# Patient Record
Sex: Male | Born: 1975 | Race: White | Hispanic: No | Marital: Single | Smoking: Never smoker
Health system: Southern US, Community
[De-identification: ages and names within clinical notes are randomized; demographics above are authoritative.]

## PROBLEM LIST (undated history)

## (undated) DIAGNOSIS — E739 Lactose intolerance, unspecified: Secondary | ICD-10-CM

## (undated) DIAGNOSIS — Z8719 Personal history of other diseases of the digestive system: Secondary | ICD-10-CM

## (undated) DIAGNOSIS — I10 Essential (primary) hypertension: Secondary | ICD-10-CM

## (undated) DIAGNOSIS — I499 Cardiac arrhythmia, unspecified: Secondary | ICD-10-CM

## (undated) HISTORY — PX: COLONOSCOPY: SHX174

## (undated) HISTORY — PX: EYE SURGERY: SHX253

---

## 2005-02-09 ENCOUNTER — Emergency Department (HOSPITAL_COMMUNITY): Admission: EM | Admit: 2005-02-09 | Discharge: 2005-02-09 | Payer: Self-pay | Admitting: Emergency Medicine

## 2007-08-11 ENCOUNTER — Encounter (INDEPENDENT_AMBULATORY_CARE_PROVIDER_SITE_OTHER): Payer: Self-pay | Admitting: *Deleted

## 2007-08-11 ENCOUNTER — Ambulatory Visit: Payer: Self-pay | Admitting: Internal Medicine

## 2007-08-11 DIAGNOSIS — R197 Diarrhea, unspecified: Secondary | ICD-10-CM

## 2007-09-20 ENCOUNTER — Encounter: Payer: Self-pay | Admitting: Gastroenterology

## 2007-09-20 ENCOUNTER — Ambulatory Visit: Payer: Self-pay | Admitting: Gastroenterology

## 2007-09-20 ENCOUNTER — Ambulatory Visit: Payer: Self-pay | Admitting: Internal Medicine

## 2007-09-20 DIAGNOSIS — K922 Gastrointestinal hemorrhage, unspecified: Secondary | ICD-10-CM | POA: Insufficient documentation

## 2007-09-20 DIAGNOSIS — K589 Irritable bowel syndrome without diarrhea: Secondary | ICD-10-CM

## 2007-09-20 LAB — CONVERTED CEMR LAB
ALT: 22 units/L (ref 0–53)
AST: 25 units/L (ref 0–37)
Albumin: 4.2 g/dL (ref 3.5–5.2)
BUN: 10 mg/dL (ref 6–23)
Basophils Absolute: 0 10*3/uL (ref 0.0–0.1)
Basophils Relative: 0.4 % (ref 0.0–1.0)
Bilirubin, Direct: 0.2 mg/dL (ref 0.0–0.3)
CRP, High Sensitivity: 3 (ref 0.00–5.00)
Calcium: 9.3 mg/dL (ref 8.4–10.5)
Chloride: 107 meq/L (ref 96–112)
Creatinine, Ser: 0.9 mg/dL (ref 0.4–1.5)
Eosinophils Absolute: 0.1 10*3/uL (ref 0.0–0.7)
Eosinophils Relative: 1.9 % (ref 0.0–5.0)
Glucose, Bld: 106 mg/dL — ABNORMAL HIGH (ref 70–99)
LDL Cholesterol: 115 mg/dL — ABNORMAL HIGH (ref 0–99)
Lymphocytes Relative: 34.1 % (ref 12.0–46.0)
MCV: 91.7 fL (ref 78.0–100.0)
Monocytes Absolute: 0.3 10*3/uL (ref 0.1–1.0)
Monocytes Relative: 7.6 % (ref 3.0–12.0)
Neutrophils Relative %: 56 % (ref 43.0–77.0)
Platelets: 216 10*3/uL (ref 150–400)
Potassium: 4.6 meq/L (ref 3.5–5.1)
RBC: 5.16 M/uL (ref 4.22–5.81)
RDW: 11.8 % (ref 11.5–14.6)
TSH: 0.97 microintl units/mL (ref 0.35–5.50)
Total Bilirubin: 0.9 mg/dL (ref 0.3–1.2)
Total Protein: 7.1 g/dL (ref 6.0–8.3)
VLDL: 9 mg/dL (ref 0–40)
WBC: 4.4 10*3/uL — ABNORMAL LOW (ref 4.5–10.5)

## 2007-09-29 ENCOUNTER — Ambulatory Visit: Payer: Self-pay | Admitting: Internal Medicine

## 2007-09-29 DIAGNOSIS — R7309 Other abnormal glucose: Secondary | ICD-10-CM

## 2007-09-29 DIAGNOSIS — J029 Acute pharyngitis, unspecified: Secondary | ICD-10-CM

## 2007-09-29 LAB — CONVERTED CEMR LAB: Rapid Strep: NEGATIVE

## 2007-10-17 ENCOUNTER — Ambulatory Visit: Payer: Self-pay | Admitting: Gastroenterology

## 2013-11-03 ENCOUNTER — Other Ambulatory Visit: Payer: Self-pay | Admitting: Orthopedic Surgery

## 2013-11-08 ENCOUNTER — Encounter (HOSPITAL_COMMUNITY): Payer: Self-pay | Admitting: *Deleted

## 2013-11-08 MED ORDER — CEFAZOLIN SODIUM-DEXTROSE 2-3 GM-% IV SOLR
2.0000 g | INTRAVENOUS | Status: AC
  Administered 2013-11-09: 2 g via INTRAVENOUS
  Filled 2013-11-08: qty 50

## 2013-11-08 MED ORDER — POVIDONE-IODINE 7.5 % EX SOLN
Freq: Once | CUTANEOUS | Status: DC
  Filled 2013-11-08: qty 118

## 2013-11-08 NOTE — Progress Notes (Signed)
Pt returned call for pre-op. Did not have his medications with him. Only taking something for pain, thinks it's Naprosyn and he has one ? Hydrocodone left. I told him that a pharmacy tech might call him sometime today and if he could get his meds so that he can give them to the tech. I told him if he doesn't here from the pharmacy tech, to bring his meds with him in the AM to surgery. He voiced understanding.

## 2013-11-09 ENCOUNTER — Encounter (HOSPITAL_COMMUNITY)
Admission: RE | Disposition: A | Discharge status: Home / Self Care | Payer: Self-pay | Source: Ambulatory Visit | Attending: Orthopedic Surgery

## 2013-11-09 ENCOUNTER — Ambulatory Visit (HOSPITAL_COMMUNITY)
Admission: RE | Admit: 2013-11-09 | Discharge: 2013-11-09 | Disposition: A | Discharge status: Home / Self Care | Payer: Managed Care, Other (non HMO) | Source: Ambulatory Visit | Attending: Orthopedic Surgery | Admitting: Orthopedic Surgery

## 2013-11-09 ENCOUNTER — Ambulatory Visit (HOSPITAL_COMMUNITY): Payer: Managed Care, Other (non HMO)

## 2013-11-09 ENCOUNTER — Encounter (HOSPITAL_COMMUNITY): Payer: Managed Care, Other (non HMO) | Admitting: Anesthesiology

## 2013-11-09 ENCOUNTER — Encounter (HOSPITAL_COMMUNITY): Payer: Self-pay | Admitting: Anesthesiology

## 2013-11-09 ENCOUNTER — Ambulatory Visit (HOSPITAL_COMMUNITY): Payer: Managed Care, Other (non HMO) | Admitting: Anesthesiology

## 2013-11-09 DIAGNOSIS — S42009A Fracture of unspecified part of unspecified clavicle, initial encounter for closed fracture: Secondary | ICD-10-CM | POA: Insufficient documentation

## 2013-11-09 DIAGNOSIS — Y929 Unspecified place or not applicable: Secondary | ICD-10-CM | POA: Insufficient documentation

## 2013-11-09 DIAGNOSIS — K589 Irritable bowel syndrome without diarrhea: Secondary | ICD-10-CM | POA: Insufficient documentation

## 2013-11-09 DIAGNOSIS — E739 Lactose intolerance, unspecified: Secondary | ICD-10-CM | POA: Insufficient documentation

## 2013-11-09 DIAGNOSIS — I10 Essential (primary) hypertension: Secondary | ICD-10-CM | POA: Insufficient documentation

## 2013-11-09 HISTORY — DX: Lactose intolerance, unspecified: E73.9

## 2013-11-09 HISTORY — DX: Personal history of other diseases of the digestive system: Z87.19

## 2013-11-09 HISTORY — DX: Cardiac arrhythmia, unspecified: I49.9

## 2013-11-09 HISTORY — PX: ORIF CLAVICULAR FRACTURE: SHX5055

## 2013-11-09 HISTORY — DX: Essential (primary) hypertension: I10

## 2013-11-09 LAB — CBC
HCT: 45.6 % (ref 39.0–52.0)
HEMOGLOBIN: 16.2 g/dL (ref 13.0–17.0)
MCH: 32.3 pg (ref 26.0–34.0)
MCHC: 35.5 g/dL (ref 30.0–36.0)
MCV: 91 fL (ref 78.0–100.0)
Platelets: 212 10*3/uL (ref 150–400)
RBC: 5.01 MIL/uL (ref 4.22–5.81)
RDW: 12.1 % (ref 11.5–15.5)
WBC: 4.5 10*3/uL (ref 4.0–10.5)

## 2013-11-09 SURGERY — OPEN REDUCTION INTERNAL FIXATION (ORIF) CLAVICULAR FRACTURE
Anesthesia: General | Site: Shoulder | Laterality: Left

## 2013-11-09 MED ORDER — MIDAZOLAM HCL 2 MG/2ML IJ SOLN
INTRAMUSCULAR | Status: AC
  Filled 2013-11-09: qty 2

## 2013-11-09 MED ORDER — DOCUSATE SODIUM 100 MG PO CAPS: 100.0000 mg | ORAL_CAPSULE | Freq: Three times a day (TID) | ORAL | Status: AC | PRN

## 2013-11-09 MED ORDER — PROPOFOL 10 MG/ML IV BOLUS
INTRAVENOUS | Status: AC
  Filled 2013-11-09: qty 20

## 2013-11-09 MED ORDER — ROCURONIUM BROMIDE 50 MG/5ML IV SOLN
INTRAVENOUS | Status: AC
  Filled 2013-11-09: qty 1

## 2013-11-09 MED ORDER — BUPIVACAINE-EPINEPHRINE 0.25% -1:200000 IJ SOLN
INTRAMUSCULAR | Status: DC | PRN
  Administered 2013-11-09: 10 mL

## 2013-11-09 MED ORDER — FENTANYL CITRATE 0.05 MG/ML IJ SOLN
INTRAMUSCULAR | Status: DC | PRN
  Administered 2013-11-09 (×2): 50 ug via INTRAVENOUS
  Administered 2013-11-09: 100 ug via INTRAVENOUS
  Administered 2013-11-09 (×2): 50 ug via INTRAVENOUS

## 2013-11-09 MED ORDER — LACTATED RINGERS IV SOLN
INTRAVENOUS | Status: DC | PRN
  Administered 2013-11-09 (×2): via INTRAVENOUS

## 2013-11-09 MED ORDER — MIDAZOLAM HCL 5 MG/5ML IJ SOLN
INTRAMUSCULAR | Status: DC | PRN
  Administered 2013-11-09: 2 mg via INTRAVENOUS

## 2013-11-09 MED ORDER — SODIUM CHLORIDE 0.9 % IR SOLN
Status: DC | PRN
  Administered 2013-11-09: 1000 mL

## 2013-11-09 MED ORDER — GLYCOPYRROLATE 0.2 MG/ML IJ SOLN
INTRAMUSCULAR | Status: DC | PRN
  Administered 2013-11-09: 0.6 mg via INTRAVENOUS
  Administered 2013-11-09: 0.2 mg via INTRAVENOUS

## 2013-11-09 MED ORDER — ROCURONIUM BROMIDE 100 MG/10ML IV SOLN
INTRAVENOUS | Status: DC | PRN
Start: 2013-11-09 — End: 2013-11-09
  Administered 2013-11-09: 50 mg via INTRAVENOUS

## 2013-11-09 MED ORDER — NEOSTIGMINE METHYLSULFATE 10 MG/10ML IV SOLN
INTRAVENOUS | Status: DC | PRN
  Administered 2013-11-09: 3 mg via INTRAVENOUS

## 2013-11-09 MED ORDER — DEXTROSE 5 % IV SOLN
10.0000 mg | INTRAVENOUS | Status: DC | PRN
  Administered 2013-11-09: 20 ug/min via INTRAVENOUS

## 2013-11-09 MED ORDER — FENTANYL CITRATE 0.05 MG/ML IJ SOLN
INTRAMUSCULAR | Status: AC
  Filled 2013-11-09: qty 5

## 2013-11-09 MED ORDER — HYDROMORPHONE HCL PF 1 MG/ML IJ SOLN
0.2500 mg | INTRAMUSCULAR | Status: DC | PRN
  Administered 2013-11-09: 0.5 mg via INTRAVENOUS
  Administered 2013-11-09 (×2): 0.25 mg via INTRAVENOUS
  Administered 2013-11-09 (×2): 0.5 mg via INTRAVENOUS

## 2013-11-09 MED ORDER — OXYCODONE-ACETAMINOPHEN 5-325 MG PO TABS: 1.0000 | ORAL_TABLET | ORAL | Status: AC | PRN

## 2013-11-09 MED ORDER — LACTATED RINGERS IV SOLN
INTRAVENOUS | Status: DC
  Administered 2013-11-09: 09:00:00 via INTRAVENOUS

## 2013-11-09 MED ORDER — PROPOFOL 10 MG/ML IV BOLUS
INTRAVENOUS | Status: DC | PRN
  Administered 2013-11-09: 200 mg via INTRAVENOUS

## 2013-11-09 MED ORDER — OXYCODONE HCL 5 MG/5ML PO SOLN: 5.0000 mg | Freq: Once | ORAL | Status: AC | PRN

## 2013-11-09 MED ORDER — ONDANSETRON HCL 4 MG/2ML IJ SOLN
INTRAMUSCULAR | Status: AC
  Filled 2013-11-09: qty 2

## 2013-11-09 MED ORDER — OXYCODONE HCL 5 MG PO TABS
ORAL_TABLET | ORAL | Status: AC
  Filled 2013-11-09: qty 1

## 2013-11-09 MED ORDER — HYDROMORPHONE HCL PF 1 MG/ML IJ SOLN
INTRAMUSCULAR | Status: AC
  Filled 2013-11-09: qty 1

## 2013-11-09 MED ORDER — LIDOCAINE HCL (CARDIAC) 20 MG/ML IV SOLN
INTRAVENOUS | Status: AC
  Filled 2013-11-09: qty 5

## 2013-11-09 MED ORDER — OXYCODONE HCL 5 MG PO TABS
5.0000 mg | ORAL_TABLET | Freq: Once | ORAL | Status: AC | PRN
  Administered 2013-11-09: 5 mg via ORAL

## 2013-11-09 MED ORDER — LIDOCAINE HCL (CARDIAC) 20 MG/ML IV SOLN
INTRAVENOUS | Status: DC | PRN
  Administered 2013-11-09: 100 mg via INTRATRACHEAL

## 2013-11-09 MED ORDER — PROMETHAZINE HCL 25 MG/ML IJ SOLN: 6.2500 mg | INTRAMUSCULAR | Status: DC | PRN

## 2013-11-09 MED ORDER — BUPIVACAINE-EPINEPHRINE (PF) 0.25% -1:200000 IJ SOLN
INTRAMUSCULAR | Status: AC
  Filled 2013-11-09: qty 30

## 2013-11-09 MED ORDER — ONDANSETRON HCL 4 MG/2ML IJ SOLN
INTRAMUSCULAR | Status: DC | PRN
  Administered 2013-11-09: 4 mg via INTRAVENOUS

## 2013-11-09 SURGICAL SUPPLY — 52 items
BIT DRILL 2.8X5 QR DISP (BIT) ×2 IMPLANT
CHLORAPREP W/TINT 26ML (MISCELLANEOUS) ×3 IMPLANT
CLOSURE WOUND 1/2 X4 (GAUZE/BANDAGES/DRESSINGS) ×1
COVER SURGICAL LIGHT HANDLE (MISCELLANEOUS) ×3 IMPLANT
DRAPE C-ARM 42X72 X-RAY (DRAPES) ×3 IMPLANT
DRAPE INCISE IOBAN 66X45 STRL (DRAPES) ×3 IMPLANT
DRAPE U-SHAPE 47X51 STRL (DRAPES) ×3 IMPLANT
DRSG MEPILEX BORDER 4X8 (GAUZE/BANDAGES/DRESSINGS) ×2 IMPLANT
ELECT REM PT RETURN 9FT ADLT (ELECTROSURGICAL) ×3
ELECTRODE REM PT RTRN 9FT ADLT (ELECTROSURGICAL) IMPLANT
GLOVE BIO SURGEON STRL SZ7 (GLOVE) ×3 IMPLANT
GLOVE BIO SURGEON STRL SZ7.5 (GLOVE) ×3 IMPLANT
GLOVE BIOGEL PI IND STRL 7.0 (GLOVE) ×1 IMPLANT
GLOVE BIOGEL PI IND STRL 8 (GLOVE) ×1 IMPLANT
GLOVE BIOGEL PI INDICATOR 7.0 (GLOVE) ×2
GLOVE BIOGEL PI INDICATOR 8 (GLOVE) ×2
GOWN STRL REUS W/ TWL LRG LVL3 (GOWN DISPOSABLE) ×3 IMPLANT
GOWN STRL REUS W/ TWL XL LVL3 (GOWN DISPOSABLE) ×1 IMPLANT
GOWN STRL REUS W/TWL LRG LVL3 (GOWN DISPOSABLE) ×9
GOWN STRL REUS W/TWL XL LVL3 (GOWN DISPOSABLE) ×3
KIT BASIN OR (CUSTOM PROCEDURE TRAY) ×3 IMPLANT
KIT BIO-TENODESIS 3X8 DISP (MISCELLANEOUS)
KIT INSRT BABSR STRL DISP BTN (MISCELLANEOUS) IMPLANT
KIT ROOM TURNOVER OR (KITS) ×3 IMPLANT
MANIFOLD NEPTUNE WASTE (CANNULA) ×3 IMPLANT
NDL HYPO 25GX1X1/2 BEV (NEEDLE) ×1 IMPLANT
NDL SPNL 18GX3.5 QUINCKE PK (NEEDLE) ×1 IMPLANT
NEEDLE HYPO 25GX1X1/2 BEV (NEEDLE) ×3 IMPLANT
NEEDLE SPNL 18GX3.5 QUINCKE PK (NEEDLE) ×3 IMPLANT
NS IRRIG 1000ML POUR BTL (IV SOLUTION) ×3 IMPLANT
PACK SHOULDER (CUSTOM PROCEDURE TRAY) ×3 IMPLANT
PAD ARMBOARD 7.5X6 YLW CONV (MISCELLANEOUS) ×6 IMPLANT
PLATE CLAVICLE 10 HOLE (Plate) ×2 IMPLANT
RETRIEVER SUT HEWSON (MISCELLANEOUS) ×2 IMPLANT
SCREW 3.5MMX14.0MM (Screw) ×6 IMPLANT
SCREW CORT 3.5X14 (Screw) ×2 IMPLANT
SCREW CORT 3.5X16 (Screw) ×4 IMPLANT
SLING ARM IMMOBILIZER LRG (SOFTGOODS) ×2 IMPLANT
SLING ARM LRG ADULT FOAM STRAP (SOFTGOODS) ×3 IMPLANT
SLING ARM MED ADULT FOAM STRAP (SOFTGOODS) IMPLANT
STRIP CLOSURE SKIN 1/2X4 (GAUZE/BANDAGES/DRESSINGS) ×2 IMPLANT
SUCTION FRAZIER TIP 10 FR DISP (SUCTIONS) ×3 IMPLANT
SUT FIBERWIRE #2 38 T-5 BLUE (SUTURE) ×3
SUT MON AB 4-0 PC3 18 (SUTURE) ×3 IMPLANT
SUT PDS AB 1 CT  36 (SUTURE) ×2
SUT PDS AB 1 CT 36 (SUTURE) ×1 IMPLANT
SUT VICRYL 0 CT 1 36IN (SUTURE) ×3 IMPLANT
SUTURE FIBERWR #2 38 T-5 BLUE (SUTURE) IMPLANT
SYR CONTROL 10ML LL (SYRINGE) ×3 IMPLANT
TOWEL OR 17X24 6PK STRL BLUE (TOWEL DISPOSABLE) ×3 IMPLANT
TOWEL OR 17X26 10 PK STRL BLUE (TOWEL DISPOSABLE) ×3 IMPLANT
WATER STERILE IRR 1000ML POUR (IV SOLUTION) ×3 IMPLANT
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
## 2013-11-09 NOTE — Transfer of Care (Signed)
Immediate Anesthesia Transfer of Care Note  Patient: Charles Andrade  Procedure(s) Performed: Procedure(s) with comments: OPEN REDUCTION INTERNAL FIXATION (ORIF) CLAVICULAR FRACTURE (Left) - Left open reduction internal fixatin clavical fracture  Patient Location: PACU  Anesthesia Type:General  Level of Consciousness: awake, alert , oriented and patient cooperative  Airway & Oxygen Therapy: Patient Spontanous Breathing and Patient connected to nasal cannula oxygen  Post-op Assessment: Report given to PACU RN and Post -op Vital signs reviewed and stable  Post vital signs: Reviewed  Complications: No apparent anesthesia complications
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
## 2013-11-09 NOTE — Anesthesia Preprocedure Evaluation (Signed)
Anesthesia Evaluation  Patient identified by MRN, date of birth, ID band Patient awake    Reviewed: Allergy & Precautions, H&P , NPO status , Patient's Chart, lab work & pertinent test results  Airway Mallampati: II TM Distance: >3 FB Neck ROM: full    Dental  (+) Teeth Intact, Dental Advidsory Given   Pulmonary neg pulmonary ROS,  breath sounds clear to auscultation        Cardiovascular hypertension, Rhythm:regular Rate:Normal     Neuro/Psych negative neurological ROS  negative psych ROS   GI/Hepatic negative GI ROS, Neg liver ROS,   Endo/Other  negative endocrine ROS  Renal/GU negative Renal ROS     Musculoskeletal   Abdominal   Peds  Hematology   Anesthesia Other Findings   Reproductive/Obstetrics negative OB ROS                           Anesthesia Physical Anesthesia Plan  ASA: II  Anesthesia Plan: General ETT   Post-op Pain Management:    Induction:   Airway Management Planned:   Additional Equipment:   Intra-op Plan:   Post-operative Plan:   Informed Consent: I have reviewed the patients History and Physical, chart, labs and discussed the procedure including the risks, benefits and alternatives for the proposed anesthesia with the patient or authorized representative who has indicated his/her understanding and acceptance.   Dental Advisory Given  Plan Discussed with: Anesthesiologist, CRNA and Surgeon  Anesthesia Plan Comments:         Anesthesia Quick Evaluation

## 2013-11-09 NOTE — Anesthesia Procedure Notes (Signed)
Procedure Name: Intubation Date/Time: 11/09/2013 10:28 AM Performed by: Romie Minus K Pre-anesthesia Checklist: Patient identified, Emergency Drugs available, Suction available, Patient being monitored and Timeout performed Patient Re-evaluated:Patient Re-evaluated prior to inductionOxygen Delivery Method: Circle system utilized Preoxygenation: Pre-oxygenation with 100% oxygen Intubation Type: IV induction Ventilation: Mask ventilation without difficulty Laryngoscope Size: Miller and 3 Grade View: Grade I Tube type: Oral Tube size: 7.5 mm Number of attempts: 1 Airway Equipment and Method: Stylet Placement Confirmation: ETT inserted through vocal cords under direct vision,  positive ETCO2,  CO2 detector and breath sounds checked- equal and bilateral Secured at: 23 cm Tube secured with: Tape Dental Injury: Teeth and Oropharynx as per pre-operative assessment

## 2013-11-09 NOTE — Anesthesia Postprocedure Evaluation (Signed)
Anesthesia Post Note  Patient: Charles Andrade  Procedure(s) Performed: Procedure(s) (LRB): OPEN REDUCTION INTERNAL FIXATION (ORIF) CLAVICULAR FRACTURE (Left)  Anesthesia type: general  Patient location: PACU  Post pain: Pain level controlled  Post assessment: Patient's Cardiovascular Status Stable  Last Vitals:  Filed Vitals:   11/09/13 1315  BP: 143/82  Pulse: 51  Temp:   Resp: 14    Post vital signs: Reviewed and stable  Level of consciousness: sedated  Complications: No apparent anesthesia complications

## 2013-11-09 NOTE — Discharge Instructions (Signed)
Discharge Instructions after Open Shoulder Repair ° °A sling has been provided for you. Remain in your sling at all times. This includes sleeping in your sling.  °Use ice on the shoulder intermittently over the first 48 hours after surgery.  °Pain medicine has been prescribed for you.  °Use your medicine liberally over the first 48 hours, and then you can begin to taper your use. You may take Extra Strength Tylenol or Tylenol only in place of the pain pills. DO NOT take ANY nonsteroidal anti-inflammatory pain medications: Advil, Motrin, Ibuprofen, Aleve, Naproxen or Naprosyn.  °You may remove your dressing after two days  °You may shower 5 days after surgery. The incisions CANNOT get wet prior to 5 days. Simply allow the water to wash over the site and then pat dry. Do not rub the incisions. Make sure your axilla (armpit) is completely dry after showering.  °Take one aspirin, a day for 2 weeks after surgery, unless you have an aspirin sensitivity/ allergy or asthma. ° ° °Please call [phone] during normal business hours or [phone] after hours for any problems. Including the following: ° °- excessive redness of the incisions °- drainage for more than 4 days °- fever of more than 101.5 F ° °*Please note that pain medications will not be refilled after hours or on weekends. ° °What to eat: ° °For your first meals, you should eat lightly; only small meals initially.  If you do not have nausea, you may eat larger meals.  Avoid spicy, greasy and heavy food.   ° °General Anesthesia, Adult, Care After  °Refer to this sheet in the next few weeks. These instructions provide you with information on caring for yourself after your procedure. Your health care provider may also give you more specific instructions. Your treatment has been planned according to current medical practices, but problems sometimes occur. Call your health care provider if you have any problems or questions after your procedure.  °WHAT TO EXPECT  AFTER THE PROCEDURE  °After the procedure, it is typical to experience:  °Sleepiness.  °Nausea and vomiting. °HOME CARE INSTRUCTIONS  °For the first 24 hours after general anesthesia:  °Have a responsible person with you.  °Do not drive a car. If you are alone, do not take public transportation.  °Do not drink alcohol.  °Do not take medicine that has not been prescribed by your health care provider.  °Do not sign important papers or make important decisions.  °You may resume a normal diet and activities as directed by your health care provider.  °Change bandages (dressings) as directed.  °If you have questions or problems that seem related to general anesthesia, call the hospital and ask for the anesthetist or anesthesiologist on call. °SEEK MEDICAL CARE IF:  °You have nausea and vomiting that continue the day after anesthesia.  °You develop a rash. °SEEK IMMEDIATE MEDICAL CARE IF:  °You have difficulty breathing.  °You have chest pain.  °You have any allergic problems. °Document Released: 08/31/2000 Document Revised: 01/25/2013 Document Reviewed: 12/08/2012  °Exit[employer]® Patient Information ©2014 [employer].  ° ° °

## 2013-11-09 NOTE — Progress Notes (Signed)
Care of pt assumed by MA Torri Langston RN from L. Reynolds RN 

## 2013-11-10 ENCOUNTER — Encounter (HOSPITAL_COMMUNITY): Payer: Self-pay | Admitting: Orthopedic Surgery

## 2014-09-26 ENCOUNTER — Emergency Department (HOSPITAL_COMMUNITY)
Admission: EM | Admit: 2014-09-26 | Discharge: 2014-09-26 | Disposition: A | Discharge status: Home / Self Care | Payer: Managed Care, Other (non HMO) | Source: Home / Self Care | Attending: Emergency Medicine | Admitting: Emergency Medicine

## 2014-09-26 ENCOUNTER — Encounter (HOSPITAL_COMMUNITY): Payer: Self-pay

## 2014-09-26 ENCOUNTER — Emergency Department (INDEPENDENT_AMBULATORY_CARE_PROVIDER_SITE_OTHER): Payer: Managed Care, Other (non HMO)

## 2014-09-26 DIAGNOSIS — J40 Bronchitis, not specified as acute or chronic: Secondary | ICD-10-CM

## 2014-09-26 DIAGNOSIS — J9801 Acute bronchospasm: Secondary | ICD-10-CM | POA: Diagnosis not present

## 2014-09-26 MED ORDER — IPRATROPIUM BROMIDE 0.02 % IN SOLN
RESPIRATORY_TRACT | Status: AC
  Filled 2014-09-26: qty 2.5

## 2014-09-26 MED ORDER — LEVOFLOXACIN 750 MG PO TABS: 750.0000 mg | ORAL_TABLET | Freq: Every day | ORAL | Status: AC

## 2014-09-26 MED ORDER — ALBUTEROL SULFATE (2.5 MG/3ML) 0.083% IN NEBU
5.0000 mg | INHALATION_SOLUTION | Freq: Once | RESPIRATORY_TRACT | Status: AC
  Administered 2014-09-26: 5 mg via RESPIRATORY_TRACT

## 2014-09-26 MED ORDER — ALBUTEROL SULFATE HFA 108 (90 BASE) MCG/ACT IN AERS: 1.0000 | INHALATION_SPRAY | Freq: Four times a day (QID) | RESPIRATORY_TRACT | Status: AC | PRN

## 2014-09-26 MED ORDER — ALBUTEROL SULFATE (2.5 MG/3ML) 0.083% IN NEBU
INHALATION_SOLUTION | RESPIRATORY_TRACT | Status: AC
  Filled 2014-09-26: qty 6

## 2014-09-26 MED ORDER — PREDNISONE 10 MG PO TABS: ORAL_TABLET | ORAL | Status: AC

## 2014-09-26 MED ORDER — IPRATROPIUM BROMIDE 0.02 % IN SOLN
0.5000 mg | Freq: Once | RESPIRATORY_TRACT | Status: AC
  Administered 2014-09-26: 0.5 mg via RESPIRATORY_TRACT

## 2014-09-26 MED ORDER — TRAMADOL HCL 50 MG PO TABS: 50.0000 mg | ORAL_TABLET | Freq: Two times a day (BID) | ORAL | Status: AC | PRN

## 2014-09-26 NOTE — ED Notes (Signed)
C/o persistant cough, in spite of completion of antibiotic and steroid shot from another [facility] . NAD at present

## 2014-09-26 NOTE — Discharge Instructions (Signed)
While your chest xray today was normal, you examination suggests persistent bronchitis (inflammation of the lung bronchi). Please use medications as directed and if you have no improvement, you are strongly encouraged to follow up with [facility] (see contact information on your discharge paperwork). If symptoms become suddenly worse or severe, you will need to seek re-evaluation at your nearest emergency room.  Bronchospasm A bronchospasm is a spasm or tightening of the airways going into the lungs. During a bronchospasm breathing becomes more difficult because the airways get smaller. When this happens there can be coughing, a whistling sound when breathing (wheezing), and difficulty breathing. Bronchospasm is often associated with asthma, but not all patients who experience a bronchospasm have asthma. CAUSES  A bronchospasm is caused by inflammation or irritation of the airways. The inflammation or irritation may be triggered by:   Allergies (such as to animals, pollen, food, or mold). Allergens that cause bronchospasm may cause wheezing immediately after exposure or many hours later.   Infection. Viral infections are believed to be the most common cause of bronchospasm.   Exercise.   Irritants (such as pollution, cigarette smoke, strong odors, aerosol sprays, and paint fumes).   Weather changes. Winds increase molds and pollens in the air. Rain refreshes the air by washing irritants out. Cold air may cause inflammation.   Stress and emotional upset.  SIGNS AND SYMPTOMS   Wheezing.   Excessive nighttime coughing.   Frequent or severe coughing with a simple cold.   Chest tightness.   Shortness of breath.  DIAGNOSIS  Bronchospasm is usually diagnosed through a history and physical exam. Tests, such as chest X-rays, are sometimes done to look for other conditions. TREATMENT   Inhaled medicines can be given to open up your airways and help you breathe. The  medicines can be given using either an inhaler or a nebulizer machine.  Corticosteroid medicines may be given for severe bronchospasm, usually when it is associated with asthma. HOME CARE INSTRUCTIONS   Always have a plan prepared for seeking medical care. Know when to call your health care provider and local emergency services (911 in the [place] Know where you can access local emergency care.  Only take medicines as directed by your health care provider.  If you were prescribed an inhaler or nebulizer machine, ask your health care provider to explain how to use it correctly. Always use a spacer with your inhaler if you were given one.  It is necessary to remain calm during an attack. Try to relax and breathe more slowly.  Control your home environment in the following ways:   Change your heating and air conditioning filter at least once a month.   Limit your use of fireplaces and wood stoves.  Do not smoke and do not allow smoking in your home.   Avoid exposure to perfumes and fragrances.   Get rid of pests (such as roaches and mice) and their droppings.   Throw away plants if you see mold on them.   Keep your house clean and dust free.   Replace carpet with wood, tile, or vinyl flooring. Carpet can trap dander and dust.   Use allergy-proof pillows, mattress covers, and box spring covers.   Wash bed sheets and blankets every week in hot water and dry them in a dryer.   Use blankets that are made of polyester or cotton.   Wash hands frequently. SEEK MEDICAL CARE IF:   You have muscle aches.   You have  chest pain.   The sputum changes from clear or white to yellow, green, gray, or bloody.   The sputum you cough up gets thicker.   There are problems that may be related to the medicine you are given, such as a rash, itching, swelling, or trouble breathing.  SEEK IMMEDIATE MEDICAL CARE IF:   You have worsening wheezing and coughing even after taking  your prescribed medicines.   You have increased difficulty breathing.   You develop severe chest pain. MAKE SURE YOU:   Understand these instructions.  Will watch your condition.  Will get help right away if you are not doing well or get worse. Document Released: 05/28/2003 Document Revised: 05/30/2013 Document Reviewed: 11/14/2012 [facility] Patient Information 2015 [place], [place]. This information is not intended to replace advice given to you by your health care provider. Make sure you discuss any questions you have with your health care provider.

## 2014-09-26 NOTE — ED Provider Notes (Addendum)
CSN: [id number]     Arrival date & time 09/26/14  1224 History   First MD Initiated Contact with Patient 09/26/14 1352     Chief Complaint  Patient presents with  . Cough   (Consider location/radiation/quality/duration/timing/severity/associated sxs/prior Treatment) HPI Comments: PCP: none Works at [employer] he was treated with antibiotics and steroids recently by another local urgent care and has not had any improvement. No recent travel or possible exposures to TB. No fever, night sweats, unexplained weight loss or hemoptysis.   Patient is a 39 y.o. male presenting with cough. The history is provided by the patient.  Cough Cough characteristics:  Non-productive Severity:  Moderate Onset quality:  Gradual Duration:  3 weeks Timing:  Constant Progression:  Worsening Smoker: no   Associated symptoms: wheezing   Associated symptoms: no chest pain, no chills, no diaphoresis and no fever   Associated symptoms comment:  States his lateral lower chest wall and rib cage is very sore from coughing   Past Medical History  Diagnosis Date  . Dysrhythmia     irregular heart rate (in high school) No longer has it  . Lactose intolerance   . History of IBS   . Hypertension     on no meds for this, no problems since high school   Past Surgical History  Procedure Laterality Date  . Colonoscopy    . Eye surgery Bilateral     lasik  . Orif clavicular fracture Left 11/09/2013    Procedure: OPEN REDUCTION INTERNAL FIXATION (ORIF) CLAVICULAR FRACTURE;  Surgeon: Mable Paris, MD;  Location[facility] OR;  Service: Orthopedics;  Laterality: Left;  Left open reduction internal fixatin clavical fracture   Family History  Problem Relation Age of Onset  . CAD Mother   . Hypertension Mother   . Hypertension Father   . Diabetes type II Father   . CVA Father    History  Substance Use Topics  . Smoking status: Never Smoker   . Smokeless tobacco: Never Used  . Alcohol Use:  Yes     Comment: daily - couple of drinks a day    Review of Systems  Constitutional: Negative for fever, chills, diaphoresis and unexpected weight change.  HENT: Negative.   Eyes: Negative.   Respiratory: Positive for cough and wheezing.   Cardiovascular: Negative for chest pain, palpitations and leg swelling.       See HPI  Gastrointestinal: Negative.   Skin: Negative.   Neurological: Negative for dizziness.    Allergies  Review of patient's allergies indicates no known allergies.  Home Medications   Prior to Admission medications   Medication Sig Start Date End Date Taking? Authorizing Provider  albuterol (PROVENTIL HFA;VENTOLIN HFA) 108 (90 BASE) MCG/ACT inhaler Inhale 1-2 puffs into the lungs every 6 (six) hours as needed for wheezing or shortness of breath. Or persistent coughing 09/26/14   Mathis Fare Presson, PA  docusate sodium (COLACE) 100 MG capsule Take 1 capsule (100 mg total) by mouth 3 (three) times daily as needed. 11/09/13   Jiles Harold, PA-C  levofloxacin (LEVAQUIN) 750 MG tablet Take 1 tablet (750 mg total) by mouth daily. 09/26/14   Mathis Fare Presson, PA  oxyCODONE-acetaminophen (ROXICET) 5-325 MG per tablet Take 1-2 tablets by mouth every 4 (four) hours as needed for severe pain. 11/09/13   Jiles Harold, PA-C  predniSONE (DELTASONE) 10 MG tablet 5 tabs po QD day 1, 4 tabs po QD day 2, 3 tabs po QD day  3, 2 tabs po QD day 4, 1 tab po QD day 5 then stop 09/26/14   Ria ClockJennifer Lee H Presson, PA  traMADol (ULTRAM) 50 MG tablet Take 1 tablet (50 mg total) by mouth every 12 (twelve) hours as needed for moderate pain (and/or for cough suppression). 09/26/14   Jess BartersJennifer Lee H Presson, PA   BP 149/84 mmHg  Pulse 61  Temp(Src) 98.1 F (36.7 C) (Oral)  Resp 20  SpO2 97% Physical Exam  Constitutional: He is oriented to person, place, and time. He appears well-developed and well-nourished. No distress.  HENT:  Head: Normocephalic and atraumatic.  Right Ear:  External ear normal.  Left Ear: External ear normal.  Nose: Nose normal.  Mouth/Throat: Oropharynx is clear and moist.  Eyes: Conjunctivae are normal.  Neck: Normal range of motion. Neck supple.  Cardiovascular: Normal rate, regular rhythm and normal heart sounds.   Pulmonary/Chest: Effort normal. No stridor. No tachypnea. No respiratory distress. He has no decreased breath sounds. He has wheezes. He has no rhonchi. He has no rales. He exhibits tenderness.    +moderate diffuse wheezing Outlines are regions of chest wall tenderness with cough or palpation   Musculoskeletal: Normal range of motion.  Neurological: He is alert and oriented to person, place, and time.  Skin: Skin is warm and dry.  Psychiatric: He has a normal mood and affect. His behavior is normal.  Nursing note and vitals reviewed.   ED Course  Procedures (including critical care time) Labs Review Labs Reviewed - No data to display  Imaging Review Dg Chest 2 View  09/26/2014   CLINICAL DATA:  Cough and congestion  EXAM: CHEST  2 VIEW  COMPARISON:  None.  FINDINGS: The heart size and mediastinal contours are within normal limits. Both lungs are clear. The visualized skeletal structures show postsurgical changes in the left clavicle.  IMPRESSION: No active cardiopulmonary disease.   Electronically Signed   By: Alcide CleverMark  Lukens M.D.   On: 09/26/2014 14:19     MDM   1. Bronchitis   2. Bronchospasm   chest xray today was normal, however, examination suggests persistent bronchitis. Advised  medications as directed and if you have no improvement,  strongly encouraged to follow up with [facility] . If symptoms become suddenly worse or severe, you will need to seek re-evaluation at your nearest emergency room.    Ria ClockJennifer Lee H Presson, [place] 09/26/14 1448  10/02/2014 (Addendum): On DOS (09/26/2014), patient received Atrovent 0.5mg  and Albuterol 5mg  nebulized breathing treatment while in department. Reports  improvement following treatment.   Ria ClockJennifer Lee H Presson, [place] 10/02/14 [id number]

## 2015-03-09 IMAGING — RF DG CLAVICLE*L*
1 series · 2 of 2 positions shown · non-contrast
Comparison: None

CLINICAL DATA: Open reduction internal fixation clavicle fracture

EXAM:
DG C-ARM 61-120 MIN; LEFT CLAVICLE - 2+ VIEWS

[Series 1: run · 2 of 2 slices shown]
[im 1/2]
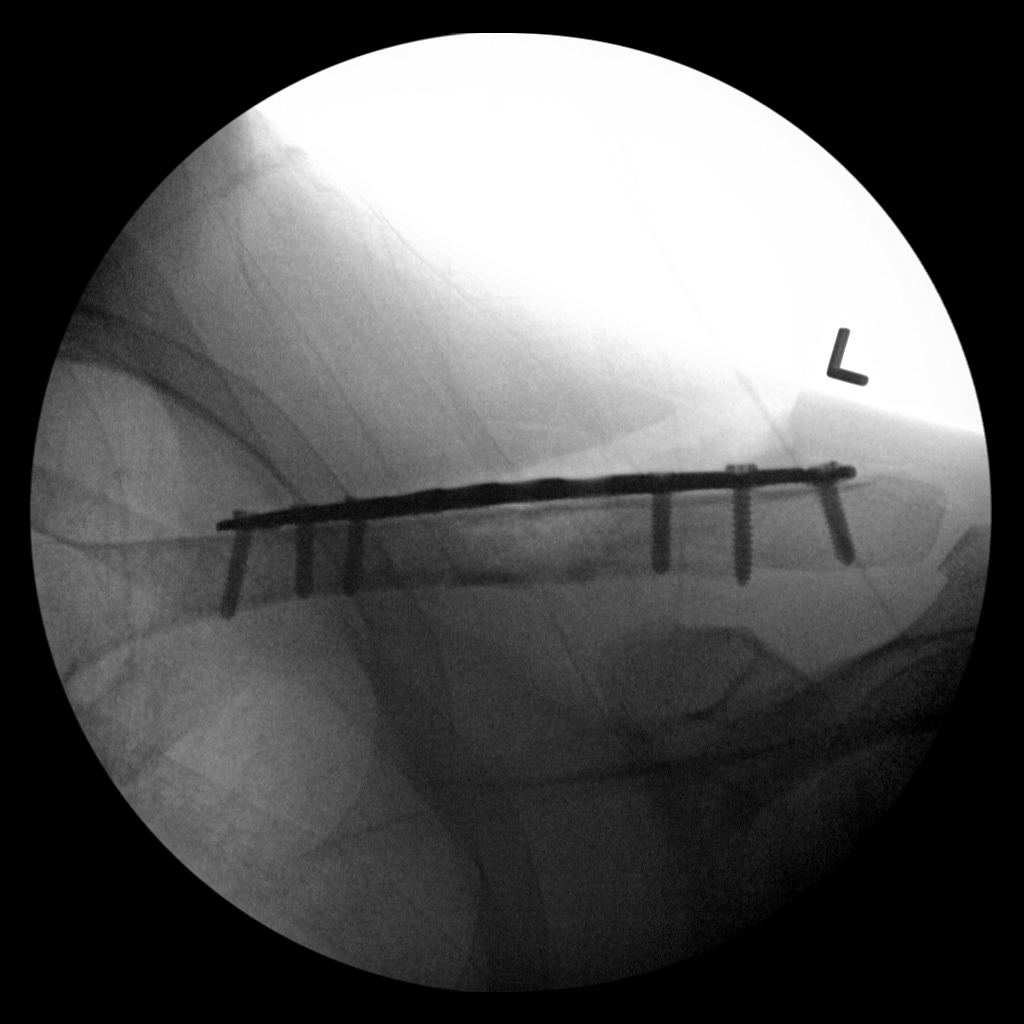
[im 2/2]
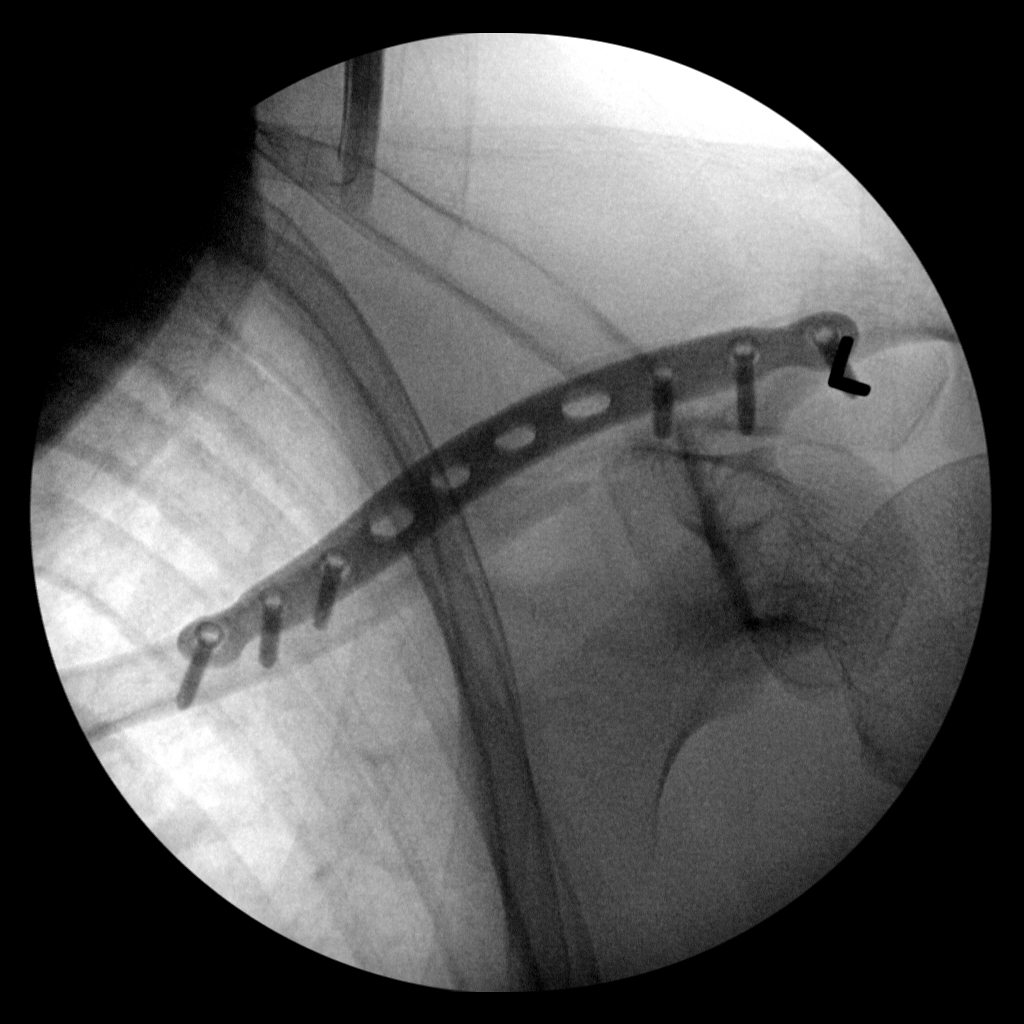

[2 of 2 positions shown; findings below may reference images not displayed]

FINDINGS: Frontal and tilt frontal images show screw and plate fixation
through a fracture of the clavicle. Alignment is anatomic. No
dislocation is appreciable on submitted images.
IMPRESSION: Screw and plate fixation through the mid to distal clavicle with
alignment essentially anatomic.

## 2015-07-01 ENCOUNTER — Encounter: Payer: Self-pay | Admitting: Gastroenterology

## 2016-01-24 IMAGING — DX DG CHEST 2V
2 series · 2 of 2 positions shown · non-contrast
Comparison: None.

CLINICAL DATA: Cough and congestion

EXAM:
CHEST  2 VIEW

[chest pa]
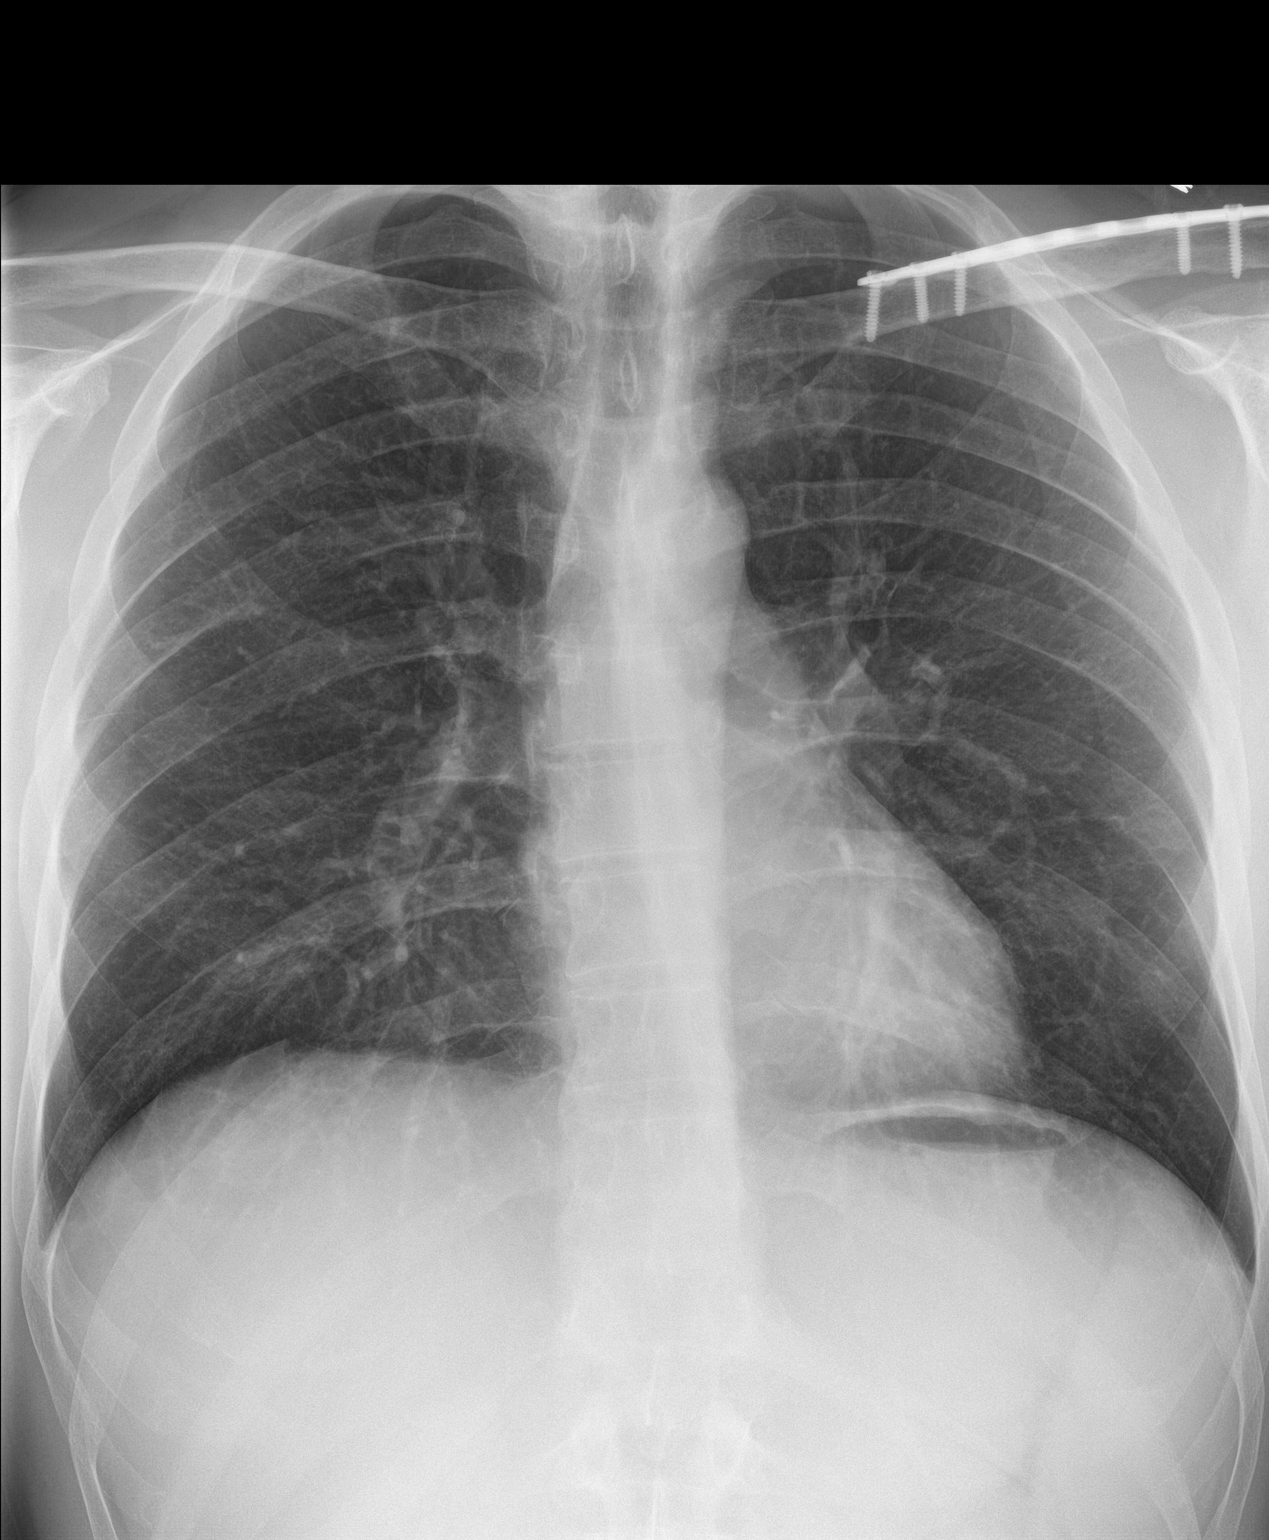

[chest lat]
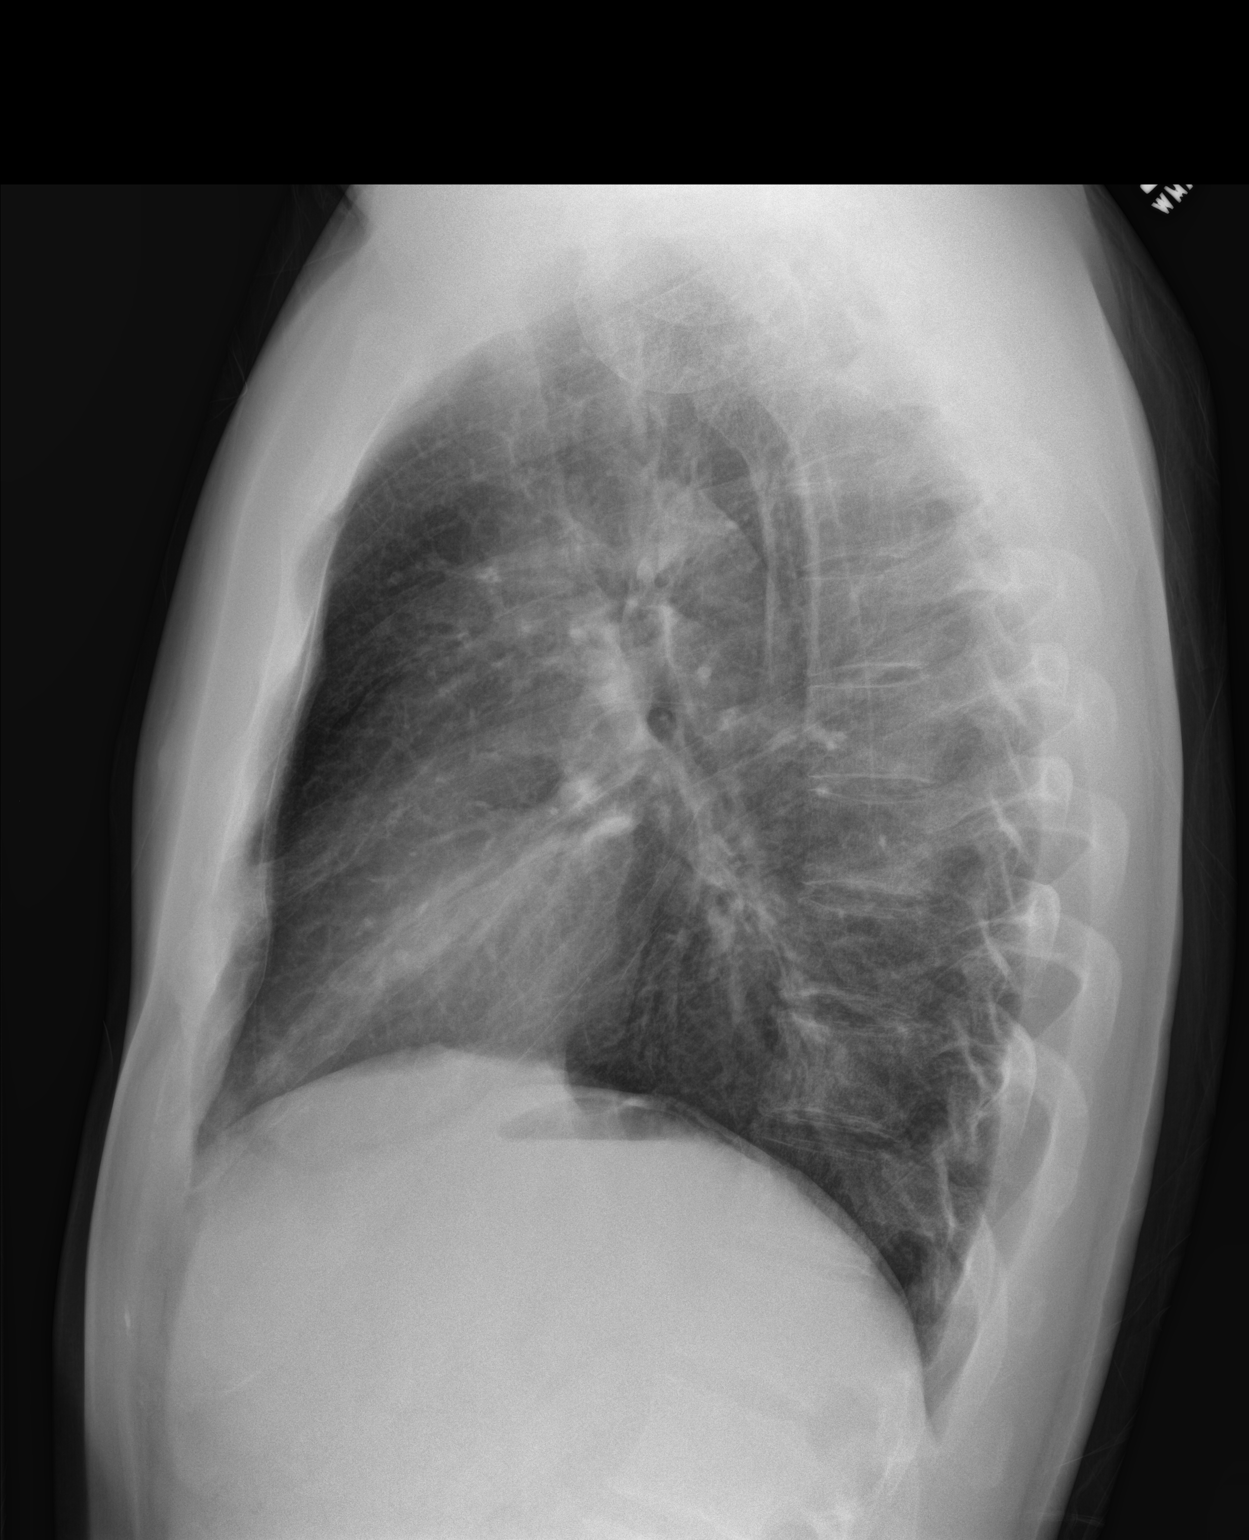

[2 of 2 positions shown; findings below may reference images not displayed]

FINDINGS: The heart size and mediastinal contours are within normal limits.
Both lungs are clear. The visualized skeletal structures show
postsurgical changes in the left clavicle.
IMPRESSION: No active cardiopulmonary disease.
